# Patient Record
Sex: Male | Born: 2000 | Race: Black or African American | Hispanic: No | Marital: Single | Smoking: Never smoker
Health system: Southern US, Community
[De-identification: ages and names within clinical notes are randomized; demographics above are authoritative.]

## PROBLEM LIST (undated history)

## (undated) DIAGNOSIS — K219 Gastro-esophageal reflux disease without esophagitis: Secondary | ICD-10-CM

## (undated) DIAGNOSIS — J302 Other seasonal allergic rhinitis: Secondary | ICD-10-CM

## (undated) DIAGNOSIS — F909 Attention-deficit hyperactivity disorder, unspecified type: Secondary | ICD-10-CM

## (undated) HISTORY — PX: LYMPH NODE BIOPSY: SHX201

## (undated) HISTORY — PX: TYMPANOSTOMY TUBE PLACEMENT: SHX32

## (undated) HISTORY — DX: Attention-deficit hyperactivity disorder, unspecified type: F90.9

---
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
## 2022-05-25 ENCOUNTER — Emergency Department (HOSPITAL_COMMUNITY): Payer: Medicaid Other

## 2022-05-25 ENCOUNTER — Other Ambulatory Visit: Payer: Self-pay

## 2022-05-25 ENCOUNTER — Encounter (HOSPITAL_COMMUNITY): Payer: Self-pay

## 2022-05-25 ENCOUNTER — Emergency Department (HOSPITAL_COMMUNITY)
Admission: EM | Admit: 2022-05-25 | Discharge: 2022-05-25 | Disposition: A | Discharge status: Home / Self Care | Payer: Medicaid Other | Attending: Emergency Medicine | Admitting: Emergency Medicine

## 2022-05-25 DIAGNOSIS — R55 Syncope and collapse: Secondary | ICD-10-CM | POA: Diagnosis not present

## 2022-05-25 DIAGNOSIS — J45909 Unspecified asthma, uncomplicated: Secondary | ICD-10-CM | POA: Diagnosis not present

## 2022-05-25 DIAGNOSIS — S0990XA Unspecified injury of head, initial encounter: Secondary | ICD-10-CM

## 2022-05-25 LAB — CBC WITH DIFFERENTIAL/PLATELET
Abs Immature Granulocytes: 0.03 10*3/uL (ref 0.00–0.07)
Basophils Absolute: 0 10*3/uL (ref 0.0–0.1)
Basophils Relative: 0 %
Eosinophils Absolute: 0 10*3/uL (ref 0.0–0.5)
Eosinophils Relative: 0 %
HCT: 43.6 % (ref 39.0–52.0)
Hemoglobin: 15.2 g/dL (ref 13.0–17.0)
Immature Granulocytes: 0 %
Lymphocytes Relative: 8 %
Lymphs Abs: 1 10*3/uL (ref 0.7–4.0)
MCH: 30.2 pg (ref 26.0–34.0)
MCHC: 34.9 g/dL (ref 30.0–36.0)
MCV: 86.5 fL (ref 80.0–100.0)
Monocytes Absolute: 0.8 10*3/uL (ref 0.1–1.0)
Monocytes Relative: 6 %
Neutro Abs: 10.4 10*3/uL — ABNORMAL HIGH (ref 1.7–7.7)
Neutrophils Relative %: 86 %
Platelets: 228 10*3/uL (ref 150–400)
RBC: 5.04 MIL/uL (ref 4.22–5.81)
RDW: 12.4 % (ref 11.5–15.5)
WBC: 12.3 10*3/uL — ABNORMAL HIGH (ref 4.0–10.5)
nRBC: 0 % (ref 0.0–0.2)

## 2022-05-25 LAB — BASIC METABOLIC PANEL
Anion gap: 8 (ref 5–15)
BUN: 15 mg/dL (ref 6–20)
CO2: 26 mmol/L (ref 22–32)
Calcium: 9 mg/dL (ref 8.9–10.3)
Chloride: 104 mmol/L (ref 98–111)
Creatinine, Ser: 1.11 mg/dL (ref 0.61–1.24)
GFR, Estimated: >60 mL/min (ref >60)
Glucose, Bld: 89 mg/dL (ref 70–99)
Potassium: 3.8 mmol/L (ref 3.5–5.1)
Sodium: 138 mmol/L (ref 135–145)

## 2022-05-25 MED ORDER — IBUPROFEN 400 MG PO TABS
600.0000 mg | ORAL_TABLET | Freq: Once | ORAL | Status: DC
  Filled 2022-05-25: qty 2

## 2022-05-25 NOTE — ED Provider Notes (Signed)
[facility] EMERGENCY DEPARTMENT Provider Note   CSN: [id number] Arrival date & time: 05/25/22  0033     History  Chief Complaint  Patient presents with   Loss of Consciousness    Lance Bailey is a 21 y.o. male.  Patient is an 21 year old male with history of ADHD, asthma presenting with complaints of possible syncope.  Patient reports he was with friends this afternoon when he stretched his back.  The next thing he knew he woke up and was nauseated and had a headache.  His friends told him that he passed out and struck the back of his head.  I am told that afterward, he was staring and not responding appropriately to his friends.  This lasted for several minutes, then resolved.  He does report an episode of vomiting after this incident.  He denies any recent symptoms.  The history is provided by the patient.       Home Medications Prior to Admission medications   Medication Sig Start Date End Date Taking? Authorizing Provider  albuterol (PROVENTIL HFA;VENTOLIN HFA) 108 (90 Base) MCG/ACT inhaler Inhale 2 puffs into the lungs every 6 (six) hours as needed for wheezing or shortness of breath. 02/03/19   Babs Sciara, MD  cetirizine (ZYRTEC) 10 MG tablet Take 1 tablet (10 mg total) by mouth daily. 02/03/19   Babs Sciara, MD  clindamycin-benzoyl peroxide (BENZACLIN) gel Apply topically 2 (two) times daily. 06/26/19   Babs Sciara, MD  ibuprofen (ADVIL) 800 MG tablet Take 1 tablet (800 mg total) by mouth every 8 (eight) hours as needed. 05/25/20   Vickki Hearing, MD  montelukast (SINGULAIR) 5 MG chewable tablet Chew 1 tablet (5 mg total) by mouth at bedtime. 02/03/19   Babs Sciara, MD  naproxen (NAPROSYN) 500 MG tablet Take 1 tablet (500 mg total) by mouth 2 (two) times daily. 03/17/21   Wurst, [place], PA-C  ondansetron (ZOFRAN ODT) 4 MG disintegrating tablet Take 1 tablet (4 mg total) by mouth every 8 (eight) hours as needed for nausea or vomiting. 09/08/21   Particia Nearing, PA-C  pantoprazole (PROTONIX) 40 MG tablet Take 1 tablet (40 mg total) by mouth daily. 09/08/21   Particia Nearing, PA-C  ADDERALL XR 10 MG 24 hr capsule Take 1 capsule (10 mg total) by mouth daily. 06/10/18 06/01/20  Babs Sciara, MD  fluticasone (FLONASE) 50 MCG/ACT nasal spray Place 2 sprays into both nostrils daily. 02/03/19 06/01/20  Babs Sciara, MD  guanFACINE (INTUNIV) 2 MG TB24 ER tablet Take 1 tablet (2 mg total) by mouth every morning. 06/10/18 06/01/20  Babs Sciara, MD      Allergies    Other    Review of Systems   Review of Systems  All other systems reviewed and are negative.   Physical Exam Updated Vital Signs Ht 5\' 9"  (1.753 m)   Wt 58.5 kg   BMI 19.05 kg/m  Physical Exam Vitals and nursing note reviewed.  Constitutional:      General: He is not in acute distress.    Appearance: He is well-developed. He is not diaphoretic.  HENT:     Head: Normocephalic and atraumatic.  Eyes:     Extraocular Movements: Extraocular movements intact.     Pupils: Pupils are equal, round, and reactive to light.  Cardiovascular:     Rate and Rhythm: Normal rate and regular rhythm.     Heart sounds: No murmur heard.  No friction rub.  Pulmonary:     Effort: Pulmonary effort is normal. No respiratory distress.     Breath sounds: Normal breath sounds. No wheezing or rales.  Abdominal:     General: Bowel sounds are normal. There is no distension.     Palpations: Abdomen is soft.     Tenderness: There is no abdominal tenderness.  Musculoskeletal:        General: Normal range of motion.     Cervical back: Normal range of motion and neck supple.  Skin:    General: Skin is warm and dry.  Neurological:     General: No focal deficit present.     Mental Status: He is alert and oriented to person, place, and time.     Cranial Nerves: No cranial nerve deficit.     Motor: No weakness.     Coordination: Coordination normal.     ED Results / Procedures  / Treatments   Labs (all labs ordered are listed, but only abnormal results are displayed) Labs Reviewed - No data to display  EKG None  Radiology No results found.  Procedures Procedures    Medications Ordered in ED Medications - No data to display  ED Course/ Medical Decision Making/ A&P  Patient presenting here after a syncopal like episode.  He reports stretching his back, then waking up with a headache.  His friend said that he fell backward, struck his head, and had a brief loss of consciousness followed by disorientation, nausea, and vomiting.  Patient arrives here neurologically intact with stable vital signs.  Head CT is unremarkable and laboratory studies are normal.  EKG shows a sinus rhythm with early repolarization, but is otherwise unremarkable.  I suspect this is a syncopal episode.  It does not sound as though there was seizure activity.  He has no loss of continence of bowel or bladder and no oral trauma.  At this point, patient to be discharged with outpatient follow-up and as needed return.  He is to take ibuprofen for what sounds like a concussion.  Final Clinical Impression(s) / ED Diagnoses Final diagnoses:  None    Rx / DC Orders ED Discharge Orders     None         Geoffery Lyons, MD 05/25/22 [phone]

## 2022-05-25 NOTE — ED Triage Notes (Signed)
Pt brought in from friend's house by mother for c/o syncope. Pt says he was outside stretching, then fell backwards, hitting head, pt does not remember this- mom says pt friend said pt got up and walked inside house and sat on couch, pt endorses this and remembers walking inside, pt friend says after he sat on couch, pt started vomiting and was "in and out of consciousness" pt mom then picked him up and says when she arrived pt was disoriented. Pt alert and oriented x 4 at this time.

## 2022-05-25 NOTE — Discharge Instructions (Signed)
Take ibuprofen 600 mg every 6 hours as needed for pain.  Follow-up with your primary doctor for recheck in the next 1 to 2 weeks, and return to the ER if symptoms significantly worsen or change.

## 2022-11-24 ENCOUNTER — Other Ambulatory Visit: Payer: Self-pay

## 2022-11-24 ENCOUNTER — Encounter (HOSPITAL_COMMUNITY): Payer: Self-pay

## 2022-11-24 DIAGNOSIS — T50991A Poisoning by other drugs, medicaments and biological substances, accidental (unintentional), initial encounter: Secondary | ICD-10-CM | POA: Diagnosis not present

## 2022-11-24 DIAGNOSIS — R1110 Vomiting, unspecified: Secondary | ICD-10-CM | POA: Insufficient documentation

## 2022-11-24 DIAGNOSIS — Z79899 Other long term (current) drug therapy: Secondary | ICD-10-CM | POA: Diagnosis not present

## 2022-11-24 DIAGNOSIS — T50901A Poisoning by unspecified drugs, medicaments and biological substances, accidental (unintentional), initial encounter: Secondary | ICD-10-CM | POA: Diagnosis not present

## 2022-11-24 DIAGNOSIS — R9431 Abnormal electrocardiogram [ECG] [EKG]: Secondary | ICD-10-CM | POA: Diagnosis not present

## 2022-11-24 NOTE — ED Triage Notes (Signed)
Pt reports he drank "Lean" and passed out and woke up vomiting.  Pt reports since vomiting he feels much better.

## 2022-11-25 ENCOUNTER — Emergency Department (HOSPITAL_COMMUNITY)
Admission: EM | Admit: 2022-11-25 | Discharge: 2022-11-25 | Disposition: A | Discharge status: Home / Self Care | Payer: Medicaid Other | Attending: Emergency Medicine | Admitting: Emergency Medicine

## 2022-11-25 DIAGNOSIS — T50901A Poisoning by unspecified drugs, medicaments and biological substances, accidental (unintentional), initial encounter: Secondary | ICD-10-CM

## 2022-11-25 LAB — RAPID URINE DRUG SCREEN, HOSP PERFORMED
Amphetamines: NOT DETECTED
Barbiturates: NOT DETECTED
Benzodiazepines: NOT DETECTED
Cocaine: NOT DETECTED
Opiates: NOT DETECTED
Tetrahydrocannabinol: POSITIVE — AB

## 2022-11-25 NOTE — ED Provider Notes (Signed)
[facility] Provider Note   CSN: [id number] Arrival date & time: 11/24/22  2241     History  Chief Complaint  Patient presents with   Ingestion    Lance Bailey is a 22 y.o. male.  The history is provided by the patient and a relative.   Patient presents after accidental overdose.  He reports he was at a party and had a "lean" drink and then passed out.  He reports he woke up spontaneously and had some vomiting.  He now feels back to baseline.  He reports the  drink may have had codeine, promethazine and it.  He denies any other coingestants.  He has no pain complaints. He request that his mother and godfather in the room during the exam Home Medications Prior to Admission medications   Medication Sig Start Date End Date Taking? Authorizing Provider  albuterol (PROVENTIL HFA;VENTOLIN HFA) 108 (90 Base) MCG/ACT inhaler Inhale 2 puffs into the lungs every 6 (six) hours as needed for wheezing or shortness of breath. 02/03/19   Kathyrn Drown, MD  cetirizine (ZYRTEC) 10 MG tablet Take 1 tablet (10 mg total) by mouth daily. 02/03/19   Kathyrn Drown, MD  clindamycin-benzoyl peroxide (BENZACLIN) gel Apply topically 2 (two) times daily. 06/26/19   Kathyrn Drown, MD  ibuprofen (ADVIL) 800 MG tablet Take 1 tablet (800 mg total) by mouth every 8 (eight) hours as needed. 05/25/20   Carole Civil, MD  montelukast (SINGULAIR) 5 MG chewable tablet Chew 1 tablet (5 mg total) by mouth at bedtime. 02/03/19   Kathyrn Drown, MD  naproxen (NAPROSYN) 500 MG tablet Take 1 tablet (500 mg total) by mouth 2 (two) times daily. 03/17/21   Wurst, [place], PA-C  ondansetron (ZOFRAN ODT) 4 MG disintegrating tablet Take 1 tablet (4 mg total) by mouth every 8 (eight) hours as needed for nausea or vomiting. 09/08/21   Volney American, PA-C  pantoprazole (PROTONIX) 40 MG tablet Take 1 tablet (40 mg total) by mouth daily. 09/08/21   Volney American, PA-C  ADDERALL XR 10  MG 24 hr capsule Take 1 capsule (10 mg total) by mouth daily. 06/10/18 06/01/20  Kathyrn Drown, MD  fluticasone (FLONASE) 50 MCG/ACT nasal spray Place 2 sprays into both nostrils daily. 02/03/19 06/01/20  Kathyrn Drown, MD  guanFACINE (INTUNIV) 2 MG TB24 ER tablet Take 1 tablet (2 mg total) by mouth every morning. 06/10/18 06/01/20  Kathyrn Drown, MD      Allergies    Other    Review of Systems   Review of Systems  Physical Exam Updated Vital Signs BP 134/60   Pulse 81   Temp 98.1 F (36.7 C) (Oral)   Resp 18   Ht 1.727 m (5\' 8" )   Wt 56.7 kg   SpO2 100%   BMI 19.01 kg/m  Physical Exam CONSTITUTIONAL: Well developed/well nourished HEAD: Normocephalic/atraumatic EYES: EOMI/PERRL, no nystagmus ENMT: Mucous membranes moist NECK: supple no meningeal signs CV: S1/S2 noted, no murmurs/rubs/gallops noted LUNGS: Lungs are clear to auscultation bilaterally, no apparent distress ABDOMEN: soft, nontender NEURO: Pt is awake/alert/appropriate, moves all extremitiesx4.  No facial droop.  Patient is ambulatory SKIN: warm, color normal PSYCH: no abnormalities of mood noted, alert and oriented to situation  ED Results / Procedures / Treatments   Labs (all labs ordered are listed, but only abnormal results are displayed) Labs Reviewed  RAPID URINE DRUG SCREEN, HOSP PERFORMED - Abnormal; Notable for the following  components:      Result Value   Tetrahydrocannabinol POSITIVE (*)    All other components within normal limits    EKG EKG Interpretation  Date/Time:  Sunday November 25 2022 02:32:15 EST Ventricular Rate:  70 PR Interval:  131 QRS Duration: 83 QT Interval:  386 QTC Calculation: 417 R Axis:   64 Text Interpretation: Sinus rhythm Atrial premature complex RSR' in V1 or V2, probably normal variant ST elev, probable normal early repol pattern No significant change since last tracing Confirmed by Zadie Rhine ([id number]) on 11/25/2022 3:04:20 AM  Radiology No results  found.  Procedures Procedures    Medications Ordered in ED Medications - No data to display  ED Course/ Medical Decision Making/ A&P                           Medical Decision Making Amount and/or Complexity of Data Reviewed Labs: ordered. ECG/medicine tests: ordered.   Patient monitored in the ER for several hours without any issues.  He reports he woke up spontaneously and had vomiting after drinking.  He is back to baseline.  UDS only positive for marijuana.  Unclear what caused this overdose However since he has been monitored for several hours without any deterioration, he is safe for discharge home.  Discussed with patient and his mother.  Agreeable with plan        Final Clinical Impression(s) / ED Diagnoses Final diagnoses:  Accidental overdose, initial encounter    Rx / DC Orders ED Discharge Orders     None         Zadie Rhine, MD 11/25/22 [phone]

## 2023-01-17 ENCOUNTER — Encounter: Payer: Self-pay | Admitting: Radiology
